# Patient Record
Sex: Male | Born: 1990 | Race: Black or African American | Hispanic: No | Marital: Single | Smoking: Current some day smoker
Health system: Southern US, Community
[De-identification: ages and names within clinical notes are randomized; demographics above are authoritative.]

## PROBLEM LIST (undated history)

## (undated) DIAGNOSIS — L309 Dermatitis, unspecified: Secondary | ICD-10-CM

## (undated) HISTORY — PX: NOSE SURGERY: SHX723

## (undated) HISTORY — DX: Dermatitis, unspecified: L30.9

---
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
## 2010-03-01 ENCOUNTER — Emergency Department (HOSPITAL_COMMUNITY)
Admission: EM | Admit: 2010-03-01 | Discharge: 2010-03-01 | Payer: Self-pay | Source: Home / Self Care | Admitting: Emergency Medicine

## 2014-01-28 ENCOUNTER — Ambulatory Visit (INDEPENDENT_AMBULATORY_CARE_PROVIDER_SITE_OTHER): Payer: 59 | Admitting: Family Medicine

## 2014-01-28 VITALS — BP 112/68 | HR 68 | Temp 99.0°F | Resp 18 | Ht 68.0 in | Wt 152.0 lb

## 2014-01-28 DIAGNOSIS — K921 Melena: Secondary | ICD-10-CM

## 2014-01-28 DIAGNOSIS — L309 Dermatitis, unspecified: Secondary | ICD-10-CM

## 2014-01-28 DIAGNOSIS — R1084 Generalized abdominal pain: Secondary | ICD-10-CM

## 2014-01-28 DIAGNOSIS — R197 Diarrhea, unspecified: Secondary | ICD-10-CM

## 2014-01-28 LAB — POCT CBC
GRANULOCYTE PERCENT: 69 % (ref 37–80)
HCT, POC: 46 % (ref 43.5–53.7)
Hemoglobin: 14.6 g/dL (ref 14.1–18.1)
Lymph, poc: 2 (ref 0.6–3.4)
MCH, POC: 26.5 pg — AB (ref 27–31.2)
MCHC: 31.7 g/dL — AB (ref 31.8–35.4)
MCV: 83.5 fL (ref 80–97)
MID (CBC): 0.2 (ref 0–0.9)
MPV: 9.9 fL (ref 0–99.8)
PLATELET COUNT, POC: 146 10*3/uL (ref 142–424)
POC Granulocyte: 4.9 (ref 2–6.9)
POC LYMPH %: 28.1 % (ref 10–50)
POC MID %: 2.9 % (ref 0–12)
RBC: 5.51 M/uL (ref 4.69–6.13)
RDW, POC: 13.4 %
WBC: 7.1 10*3/uL (ref 4.6–10.2)

## 2014-01-28 LAB — IFOBT (OCCULT BLOOD): IFOBT: POSITIVE

## 2014-01-28 MED ORDER — HYOSCYAMINE SULFATE ER 0.375 MG PO TB12: 0.3750 mg | ORAL_TABLET | Freq: Two times a day (BID) | ORAL | Status: DC

## 2014-01-28 MED ORDER — HYDROCORTISONE 2.5 % EX OINT: TOPICAL_OINTMENT | Freq: Two times a day (BID) | CUTANEOUS | Status: DC

## 2014-01-28 MED ORDER — RANITIDINE HCL 150 MG PO CAPS: 150.0000 mg | ORAL_CAPSULE | Freq: Two times a day (BID) | ORAL | Status: AC

## 2014-01-28 NOTE — Progress Notes (Addendum)
Subjective:    Patient ID: Nicholas Ingram, male    DOB: 10-15-1990, 23 y.o.   MRN: [id number]  HPI Chief Complaint  Patient presents with  . Abdominal Pain    Pt. states he has stomach pain. Stool is black when pt. wipes himself, its red. x1 week   This chart was scribed for Nilda SimmerKristi Jhace Fennell, MD by Andrew Auaven Small, ED Scribe. This patient was seen in room 8 and the patient's care was started at 5:02 PM.  HPI Comments: Nicholas Ingram is a 23 y.o. male who presents to the [facility] complaining of intermittent cramping lower abdominal pain with associated black stool. Pt states last week he ate a stromboli that he left in his car overnight for 2 days, thinking it would be cold enough. Pt reports mild blood on toilet paper only with wiping; blood was the size of a pin prick. Pt denies fever, chills, sweats, nausea, emesis, change in appetite. Pt denies chronic health problems. Pt drinks alcohol on the weekend, smokes cigars occasionally, but denies drugs.  Pt reports 2 surgeries in the past; nasal fracture surgery; revision to circumcision.  He has been taking Pepto Bismol for the past week.  Also took a laxative today hoping to alleviate symptoms.  Has had three stools today that were normal consistency.    L lateral leg with eczematous rash with excoriations; ran out of eczema cream. History reviewed. No pertinent past medical history. No Known Allergies Prior to Admission medications   Not on File   Review of Systems  Constitutional: Negative for fever, chills, diaphoresis, appetite change and fatigue.  Gastrointestinal: Positive for abdominal pain and blood in stool. Negative for nausea, vomiting, diarrhea, constipation, abdominal distention and rectal pain.  Skin: Negative for rash.  Neurological: Negative for dizziness and light-headedness.   Objective:   Physical Exam  Constitutional: He is oriented to person, place, and time. He appears well-developed and  well-nourished. No distress.  HENT:  Head: Normocephalic and atraumatic.  Mouth/Throat: Oropharynx is clear and moist.  Eyes: Conjunctivae and EOM are normal. Pupils are equal, round, and reactive to light.  Neck: Neck supple.  Cardiovascular: Normal rate, regular rhythm and normal heart sounds.  Exam reveals no gallop and no friction rub.   No murmur heard. Pulmonary/Chest: Effort normal and breath sounds normal. No respiratory distress. He has no wheezes. He has no rales.  Abdominal: Soft. Bowel sounds are normal. He exhibits no distension and no mass. There is no splenomegaly or hepatomegaly. There is tenderness in the suprapubic area. There is no rebound and no guarding. No hernia. Hernia confirmed negative in the right inguinal area and confirmed negative in the left inguinal area.  Genitourinary: Rectum normal, testes normal and penis normal. Right testis shows no mass, no swelling and no tenderness. Left testis shows no mass, no swelling and no tenderness. Circumcised.  Brown stool on glove.  Musculoskeletal: Normal range of motion.  Lymphadenopathy:    He has no cervical adenopathy.       Right: No inguinal adenopathy present.       Left: No inguinal adenopathy present.  Neurological: He is alert and oriented to person, place, and time.  Skin: Skin is warm and dry. Rash noted. He is not diaphoretic.  +scaling rash 4cm x 5 cm lateral leg with scattered excoriations.  Psychiatric: He has a normal mood and affect. His behavior is normal.  Nursing note and vitals reviewed.    Results for  orders placed or performed in visit on 01/28/14  IFOBT POC (occult bld, rslt in office)  Result Value Ref Range   IFOBT Positive   POCT CBC  Result Value Ref Range   WBC 7.1 4.6 - 10.2 K/uL   Lymph, poc 2.0 0.6 - 3.4   POC LYMPH PERCENT 28.1 10 - 50 %L   MID (cbc) 0.2 0 - 0.9   POC MID % 2.9 0 - 12 %M   POC Granulocyte 4.9 2 - 6.9   Granulocyte percent 69.0 37 - 80 %G   RBC 5.51 4.69 - 6.13  M/uL   Hemoglobin 14.6 14.1 - 18.1 g/dL   HCT, POC 16.146.0 09.643.5 - 53.7 %   MCV 83.5 80 - 97 fL   MCH, POC 26.5 (A) 27 - 31.2 pg   MCHC 31.7 (A) 31.8 - 35.4 g/dL   RDW, POC 04.513.4 %   Platelet Count, POC 146 142 - 424 K/uL   MPV 9.9 0 - 99.8 fL    Assessment & Plan:  Melena - Plan: IFOBT POC (occult bld, rslt in office), POCT CBC, Comprehensive metabolic panel  Diarrhea - Plan: IFOBT POC (occult bld, rslt in office), POCT CBC, Comprehensive metabolic panel  Generalized abdominal pain - Plan: IFOBT POC (occult bld, rslt in office), POCT CBC, Comprehensive metabolic panel, hyoscyamine (LEVBID) 0.375 MG 12 hr tablet  Eczema   1. Melena: New.  Likely secondary from recent Pepto Bismol use; recommend stopping Pepto Bismol. If melanotic stools persists, recommend follow-up in 48-72 hours.  Hemosure + but recent scan blood on toilet paper as well. If bloody stools persists, RTC 48 hours for repeat CBC. 2.  Abdominal pain and cramping: New.  Recommend BRAT diet, hydration; rx for Zantac 150mg  bid, Hyoscyamine bid.  RTC for acute worsening; obtain labs. 3.  Eczema: uncontrolled; rx for hydrocortisone 2.5% ointment bid.  4. Diarrhea: New. Mild; recommend BRAT diet, hydration.  If no improvement in upcoming week, RTC for stool studies.   Meds ordered this encounter  Medications  . hyoscyamine (LEVBID) 0.375 MG 12 hr tablet    Sig: Take 1 tablet (0.375 mg total) by mouth 2 (two) times daily.    Dispense:  20 tablet    Refill:  0  . ranitidine (ZANTAC) 150 MG capsule    Sig: Take 1 capsule (150 mg total) by mouth 2 (two) times daily.    Dispense:  60 capsule    Refill:  0  . hydrocortisone 2.5 % ointment    Sig: Apply topically 2 (two) times daily.    Dispense:  30 g    Refill:  1    I personally performed the services described in this documentation, which was scribed in my presence. The recorded information has been reviewed and considered.  Nilda SimmerKristi Jeanpaul Biehl, M.D.  [facility]  [place] [address] [place], [place]  [address] [phone] phone [phone] fax

## 2014-01-29 LAB — COMPREHENSIVE METABOLIC PANEL
ALK PHOS: 69 U/L (ref 39–117)
ALT: 21 U/L (ref 0–53)
AST: 28 U/L (ref 0–37)
Albumin: 5.3 g/dL — ABNORMAL HIGH (ref 3.5–5.2)
BUN: 13 mg/dL (ref 6–23)
CALCIUM: 10.3 mg/dL (ref 8.4–10.5)
CHLORIDE: 103 meq/L (ref 96–112)
CO2: 22 mEq/L (ref 19–32)
Creat: 1.05 mg/dL (ref 0.50–1.35)
Glucose, Bld: 102 mg/dL — ABNORMAL HIGH (ref 70–99)
Potassium: 4.1 mEq/L (ref 3.5–5.3)
Sodium: 138 mEq/L (ref 135–145)
Total Bilirubin: 0.8 mg/dL (ref 0.2–1.2)
Total Protein: 7.9 g/dL (ref 6.0–8.3)

## 2014-12-17 ENCOUNTER — Ambulatory Visit (INDEPENDENT_AMBULATORY_CARE_PROVIDER_SITE_OTHER): Payer: Commercial Managed Care - HMO | Admitting: Physician Assistant

## 2014-12-17 VITALS — BP 128/84 | HR 65 | Temp 98.5°F | Resp 14 | Ht 68.0 in | Wt 167.0 lb

## 2014-12-17 DIAGNOSIS — R369 Urethral discharge, unspecified: Secondary | ICD-10-CM

## 2014-12-17 DIAGNOSIS — Z113 Encounter for screening for infections with a predominantly sexual mode of transmission: Secondary | ICD-10-CM | POA: Diagnosis not present

## 2014-12-17 MED ORDER — AZITHROMYCIN 500 MG PO TABS: ORAL_TABLET | ORAL | Status: DC

## 2014-12-17 MED ORDER — CEFTRIAXONE SODIUM 250 MG IJ SOLR
250.0000 mg | Freq: Once | INTRAMUSCULAR | Status: AC
  Administered 2014-12-17: 250 mg via INTRAMUSCULAR

## 2014-12-17 NOTE — Patient Instructions (Signed)
Take 2 antibiotic pills with a meal. Condoms for all sexual intercourse. No sex for 10 days. I will call with your lab results.

## 2014-12-17 NOTE — Progress Notes (Signed)
[facility] [address], [place] [address] [phone]  Date:  12/17/2014   Name:  Nicholas SlaughterJaffa D Kaus   DOB:  [birth year]   MRN:  [id number]  PCP:  No PCP Per Patient    Chief Complaint: Exposure to STD   History of Present Illness:  This is a 24 y.o. male who is presenting with clear penile discharge x 2 days. He will have intermittent tingling/itching at the tip of his penis but no dysuria. He denies genital sores, abdominal pain, back pain, urinary frequency. States he had chlamydia in high school and this is similar to his sx then. Last had STD testing 3-4 months ago and negative. He has only had 1 sexual partner in the past 6 months. They were recently sexually active for the first time but have since broken up. He states she told him he could take the condom off.  Declined flu shot.  Review of Systems:  Review of Systems See HPI  There are no active problems to display for this patient.   Prior to Admission medications   Medication Sig Start Date End Date Taking? Authorizing Provider  ranitidine (ZANTAC) 150 MG capsule Take 1 capsule (150 mg total) by mouth 2 (two) times daily. 01/28/14  Yes Ethelda ChickKristi M Smith, MD    No Known Allergies  Past Surgical History  Procedure Laterality Date  . Nose surgery      Social History  Substance Use Topics  . Smoking status: Current Some Day Smoker  . Smokeless tobacco: None  . Alcohol Use: 0.0 oz/week    0 Standard drinks or equivalent per week    History reviewed. No pertinent family history.  Medication list has been reviewed and updated.  Physical Examination:  Physical Exam  Constitutional: He is oriented to person, place, and time. He appears well-developed and well-nourished. No distress.  HENT:  Head: Normocephalic and atraumatic.  Right Ear: Hearing normal.  Left Ear: Hearing normal.  Nose: Nose normal.  Eyes: Conjunctivae and lids are normal. Right eye exhibits no discharge. Left eye  exhibits no discharge. No scleral icterus.  Pulmonary/Chest: Effort normal. No respiratory distress.  Musculoskeletal: Normal range of motion.  Neurological: He is alert and oriented to person, place, and time.  Skin: Skin is warm, dry and intact. No lesion and no rash noted.  Psychiatric: He has a normal mood and affect. His speech is normal and behavior is normal. Thought content normal.   BP 128/84 mmHg  Pulse 65  Temp(Src) 98.5 F (36.9 C) (Oral)  Resp 14  Ht 5\' 8"  (1.727 m)  Wt 167 lb (75.751 kg)  BMI 25.40 kg/m2  SpO2 98%  Assessment and Plan:  1. Penile discharge 2. Screen for STD STD screening pending. Treated empirically with rocephin and zithromax. Discussed safe sex practices at length. Return prn. - GC/Chlamydia Probe Amp - cefTRIAXone (ROCEPHIN) injection 250 mg; Inject 250 mg into the muscle once. - azithromycin (ZITHROMAX) 500 MG tablet; Take 2 tabs (1 gm) po once.  Dispense: 2 tablet; Refill: 0 - HIV antibody - Hepatitis B surface antigen - Hepatitis C antibody - GC/Chlamydia Probe Amp - RPR   Roswell MinersNicole V. Dyke BrackettBush, PA-C, MHS [facility]  12/17/2014

## 2014-12-18 LAB — HIV ANTIBODY (ROUTINE TESTING W REFLEX): HIV 1&2 Ab, 4th Generation: NONREACTIVE

## 2014-12-18 LAB — HEPATITIS B SURFACE ANTIGEN: Hepatitis B Surface Ag: NEGATIVE

## 2014-12-18 LAB — HEPATITIS C ANTIBODY: HCV Ab: NEGATIVE

## 2014-12-18 LAB — RPR

## 2014-12-19 LAB — GC/CHLAMYDIA PROBE AMP
CT PROBE, AMP APTIMA: NEGATIVE
GC Probe RNA: NEGATIVE

## 2015-06-18 ENCOUNTER — Ambulatory Visit (INDEPENDENT_AMBULATORY_CARE_PROVIDER_SITE_OTHER): Payer: Commercial Managed Care - HMO

## 2015-06-18 ENCOUNTER — Ambulatory Visit (INDEPENDENT_AMBULATORY_CARE_PROVIDER_SITE_OTHER): Payer: Commercial Managed Care - HMO | Admitting: Family Medicine

## 2015-06-18 VITALS — BP 122/72 | HR 90 | Temp 98.1°F | Resp 17 | Ht 69.5 in | Wt 165.0 lb

## 2015-06-18 DIAGNOSIS — M25442 Effusion, left hand: Secondary | ICD-10-CM
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
## 2015-06-18 NOTE — Patient Instructions (Addendum)
Boxer's Fracture A boxer's fracture is a break (fracture) of the bone in your hand that connects your little finger to your wrist (fifth metacarpal). This type of fracture usually happens at the end of the bone, closest to the little finger. The knuckle is often pushed down by the impact. In some cases, only a splint or brace is needed, or you may need a cast. Casting or splinting may include taping your injured finger to the next finger (buddy taping). You may need surgery to repair the fracture. This may involve the use of wires, screws, or plates to hold the bone pieces in place.  CAUSES This injury may be caused by:   Hitting an object with a clenched fist.  A hard, direct hit to the hand.  An injury that crushes the hand. RISK FACTORS This injury is more likely to occur if:  You are in a fistfight.  You have certain bone diseases. SYMPTOMS Symptoms of this type of fracture develop soon after the injury. Symptoms may include:  Swelling of the hand.  Pain.  Pain when moving the fifth finger or touching the hand.  Abnormal position of the finger.  Not being able to move the finger.  A shortened finger.  A finger knuckle that looks sunken in. DIAGNOSIS This injury may be diagnosed based on your symptoms, especially if you had a recent hand injury. Your health care provider will perform a physical exam, and you may also have X-rays to confirm the diagnosis. TREATMENT  Treatment for this injury depends on how severe it is. Possible treatments include:  Closed reduction. If your bone is stable and can be moved back into place, you may only need to wear a cast or splint or have buddy taping.  Open reduction with internal fixation (ORIF). This may be needed if your fracture is far out of place or goes through the joint surface of the bone. This treatment involves open surgery to move your bones back into the right position. Screws, wires, or plates may be used to stabilize the  fracture. You may need to wear a cast or a splint for several weeks. You will also need to have follow-up X-rays to make sure that the bone is healing well and staying in position. After you no longer need the cast or splint, you may need physical therapy. This will help you to regain full movement and strength in your hand.  HOME CARE INSTRUCTIONS If You Have a Cast:  Do not stick anything inside the cast to scratch your skin. Doing that increases your risk of infection.  Check the skin around the cast every day. Report any concerns to your health care provider. You may put lotion on dry skin around the edges of the cast. Do not apply lotion to the skin underneath the cast. If You Have a Splint:  Wear it as directed by your health care provider. Remove it only as directed by your health care provider.  Loosen the splint if your fingers become numb and tingle, or if they turn cold and blue. Bathing  Cover the cast or splint with a watertight plastic bag to protect it from water while you take a bath or a shower. Do not let the cast or splint get wet. Managing Pain, Stiffness, and Swelling  If directed, apply ice to the injured area (if you have a splint, not a cast):  Put ice in a plastic bag.  Place a towel between your skin and the bag.  Leave the ice on for 20 minutes, 2-3 times a day.  Move your fingers often to avoid stiffness and to lessen swelling.  Raise the injured area above the level of your heart while you are sitting or lying down. Driving  Do not drive or operate heavy machinery while taking pain medicine.  Do not drive while wearing a cast or splint on a hand or foot that you use for driving. Activity  Return to your normal activities as directed by your health care provider. Ask your health care provider what activities are safe for you. General Instructions  Do not put pressure on any part of the cast or splint until it is fully hardened. This may take several  hours.  Keep the cast or splint clean and dry.  Do not use any tobacco products, including cigarettes, chewing tobacco, or electronic cigarettes. Tobacco can delay bone healing. If you need help quitting, ask your health care provider.  Take medicines only as directed by your health care provider.  Keep all follow-up visits as directed by your health care provider. This is important. SEEK MEDICAL CARE IF:  Your pain is getting worse.  You have redness, swelling, or pain in the injured area.  You have fluid, blood, or pus coming from under your cast or splint.  You notice a bad smell coming from under your cast or splint.  You have a fever.  Your cast or splint feels too tight or too loose.  You cast is coming apart. SEEK IMMEDIATE MEDICAL CARE IF:  You develop a rash.  You have trouble breathing.  Your skin or nails on your injured hand turn blue or gray even after you loosen your splint.  Your injured hand feels cold or becomes numb even after you loosen your splint.  You develop severe pain under the cast or in your hand.   This information is not intended to replace advice given to you by your health care provider. Make sure you discuss any questions you have with your health care provider.   Document Released: 02/16/2005 Document Revised: 11/07/2014 Document Reviewed: 12/06/2013 Elsevier Interactive Patient Education 2016 [employer].    IF you received an x-ray today, you will receive an invoice from [facility]. Please contact [facility] at [phone] with questions or concerns regarding your invoice.   IF you received labwork today, you will receive an invoice from [employer]. Please contact [facility] at [phone] with questions or concerns regarding your invoice.   Our billing staff will not be able to assist you with questions regarding bills from these companies.  You will be contacted with the lab results  as soon as they are available. The fastest way to get your results is to activate your My Chart account. Instructions are located on the last page of this paperwork. If you have not heard from [place] regarding the results in 2 weeks, please contact this office.

## 2015-06-23 NOTE — Progress Notes (Signed)
   Subjective:    Patient ID: Nicholas Ingram, male    DOB: 1990-11-29, 25 y.o.   MRN: [id number]  HPI  Pt returned today for wound care for boxers fracture sustained one week ago. Was seen in pour clinic 4/18, diagnosed with boxers fracture and had sugar tong splint applied. Today he returns as he feels the splint is too tight and uncomfortable.   Review of Systems     Objective:   Physical Exam        Assessment & Plan:   Sugar tong splint removed. Left UE assessed. Normal cap refill all 5 digits. 2+ radial pulse. Normal sensation across hand and digits. Decreased rom secondary to 5th digit pain. Mild-moderate diffuse swelling. No abrasions noted from prior sugar tong splint.   Ulnar gutter splint applied today. Area still ttp. Pt instructed to rtc one week for repeat assessment. He is agreeable.

## 2017-05-04 ENCOUNTER — Other Ambulatory Visit: Payer: Self-pay

## 2017-05-04 ENCOUNTER — Ambulatory Visit (HOSPITAL_COMMUNITY)
Admission: EM | Admit: 2017-05-04 | Discharge: 2017-05-04 | Disposition: A | Discharge status: Home / Self Care | Payer: Self-pay | Attending: Family Medicine | Admitting: Family Medicine

## 2017-05-04 ENCOUNTER — Encounter (HOSPITAL_COMMUNITY): Payer: Self-pay

## 2017-05-04 DIAGNOSIS — L600 Ingrowing nail: Secondary | ICD-10-CM

## 2017-05-04 MED ORDER — DOXYCYCLINE HYCLATE 100 MG PO TABS: 100.0000 mg | ORAL_TABLET | Freq: Two times a day (BID) | ORAL | 0 refills | Status: AC

## 2017-05-04 MED ORDER — LIDOCAINE HCL 2 % IJ SOLN
INTRAMUSCULAR | Status: AC
  Filled 2017-05-04: qty 20

## 2017-05-04 NOTE — ED Triage Notes (Signed)
Pt presents with complaints of ingrown toenail x 1 week.

## 2017-05-04 NOTE — Discharge Instructions (Signed)
Gently soak the toe in soapy water twice a day for two days.

## 2017-05-04 NOTE — ED Provider Notes (Signed)
[facility]   [id number] 05/04/17 Arrival Time: 1623   SUBJECTIVE:  Nicholas SlaughterJaffa D Aldous is a 27 y.o. male who presents to the urgent care with complaint of right ingrown toenail x 1 week.  Past Medical History:  Diagnosis Date  . Eczema    No family history on file. Social History   Socioeconomic History  . Marital status: Single    Spouse name: Not on file  . Number of children: Not on file  . Years of education: Not on file  . Highest education level: Not on file  Social Needs  . Financial resource strain: Not on file  . Food insecurity - worry: Not on file  . Food insecurity - inability: Not on file  . Transportation needs - medical: Not on file  . Transportation needs - non-medical: Not on file  Occupational History  . Not on file  Tobacco Use  . Smoking status: Current Some Day Smoker    Packs/day: 0.50    Years: 3.00    Pack years: 1.50    Types: Cigars, Cigarettes  Substance and Sexual Activity  . Alcohol use: Yes    Alcohol/week: 0.0 oz  . Drug use: No  . Sexual activity: Not on file  Other Topics Concern  . Not on file  Social History Narrative  . Not on file   Current Meds  Medication Sig  . ranitidine (ZANTAC) 150 MG capsule Take 1 capsule (150 mg total) by mouth 2 (two) times daily.   No Known Allergies    ROS: As per HPI, remainder of ROS negative.   OBJECTIVE:   Vitals:   05/04/17 1637  BP: (!) 142/82  Pulse: 74  Resp: 18  Temp: 97.8 F (36.6 C)  SpO2: 98%  Weight: 190 lb (86.2 kg)     General appearance: alert; no distress Eyes: PERRL; EOMI; conjunctiva normal HENT: normocephalic; atraumatic; Neck: supple Back: no CVA tenderness Extremities: no cyanosis or edema; symmetrical with no gross deformities Skin: warm and dry Neurologic: normal gait; grossly normal Psychological: alert and cooperative; normal mood and affect      Labs:  Results for orders placed or performed in visit on 12/17/14  GC/Chlamydia Probe  Amp  Result Value Ref Range   CT Probe RNA NEGATIVE    GC Probe RNA NEGATIVE   HIV antibody  Result Value Ref Range   HIV 1&2 Ab, 4th Generation NONREACTIVE NONREACTIVE  Hepatitis B surface antigen  Result Value Ref Range   Hepatitis B Surface Ag NEGATIVE NEGATIVE  Hepatitis C antibody  Result Value Ref Range   HCV Ab NEGATIVE NEGATIVE  RPR  Result Value Ref Range   RPR Ser Ql NON REAC NON REAC    Labs Reviewed - No data to display  No results found.     ASSESSMENT & PLAN:  1. Ingrown toenail of right foot with infection     Meds ordered this encounter  Medications  . doxycycline (VIBRA-TABS) 100 MG tablet    Sig: Take 1 tablet (100 mg total) by mouth 2 (two) times daily.    Dispense:  20 tablet    Refill:  0    Reviewed expectations re: course of current medical issues. Questions answered. Outlined signs and symptoms indicating need for more acute intervention. Patient verbalized understanding. After Visit Summary given.    Procedures:  Edge of right toenail removed after 2% Xylocaine local anesthetic and sterile prep.      Elvina SidleLauenstein, Willem Klingensmith, MD 05/04/17 1710

## 2017-09-21 IMAGING — CR DG HAND COMPLETE 3+V*L*
3 series · 3 of 3 positions shown · non-contrast
Comparison: None.

CLINICAL DATA: Pain and swelling following trauma

EXAM:
LEFT HAND - COMPLETE 3+ VIEW

[PA]
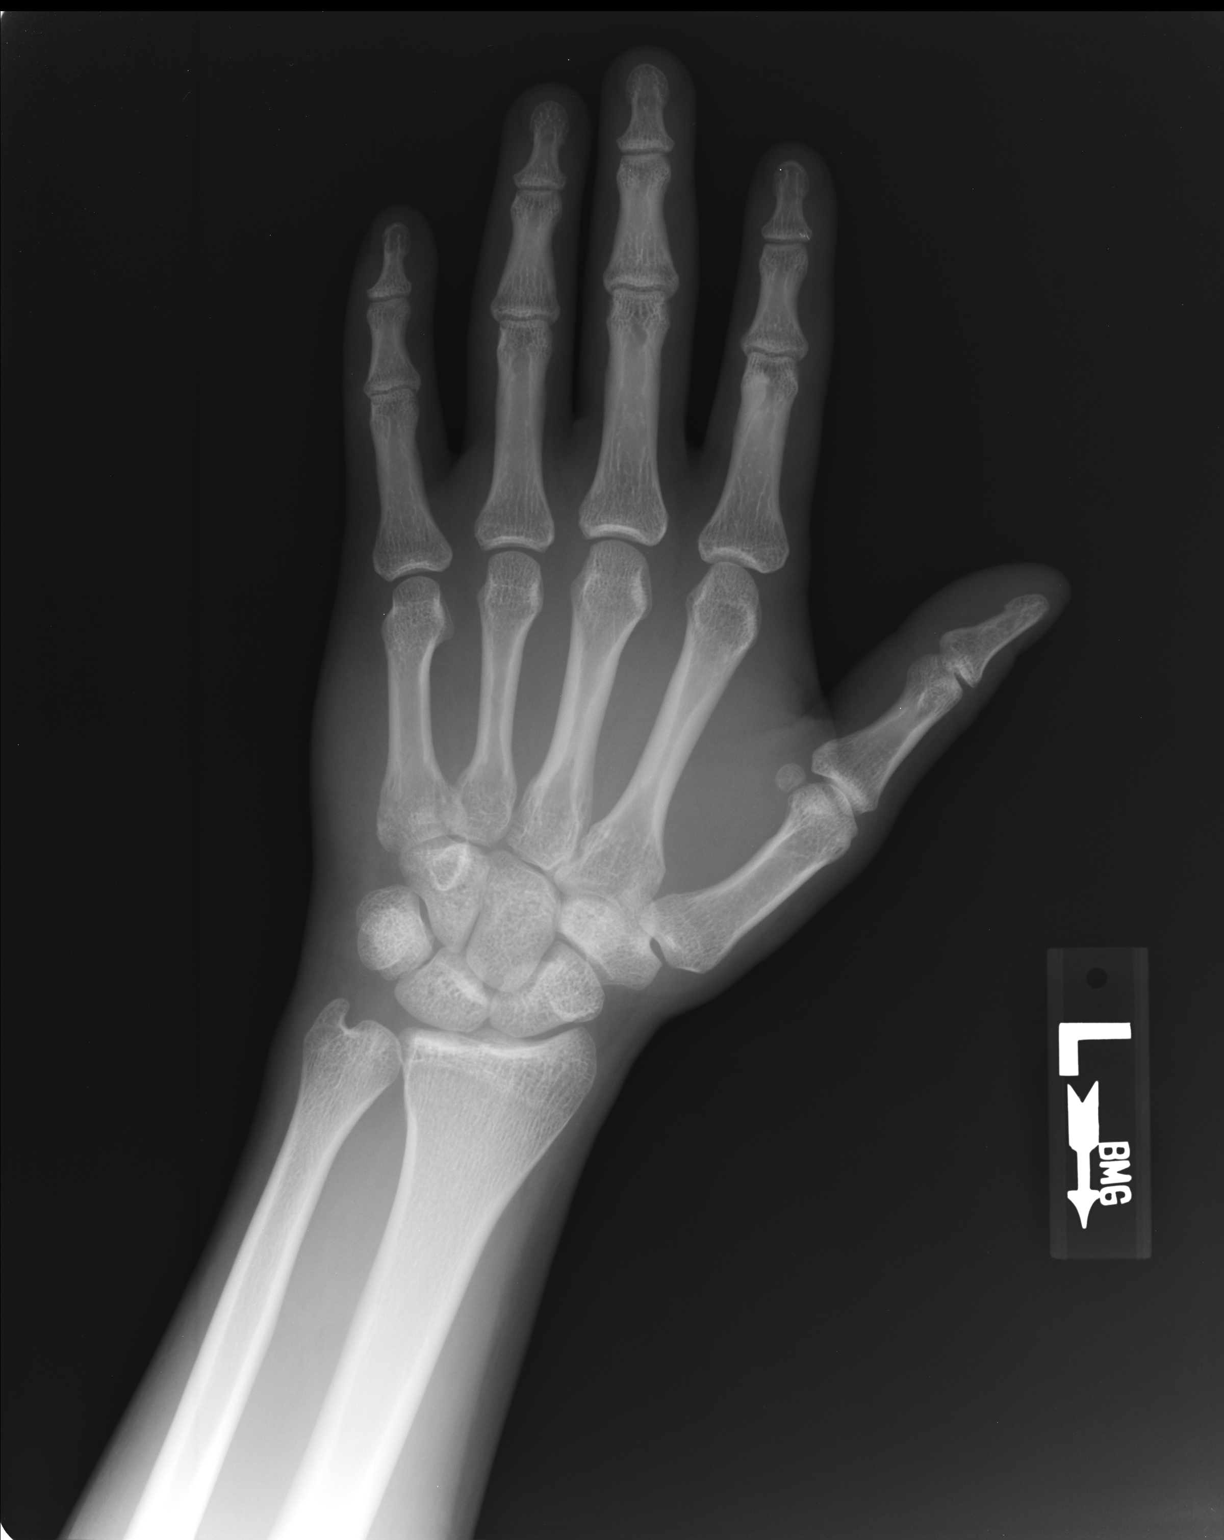

[pa obl]
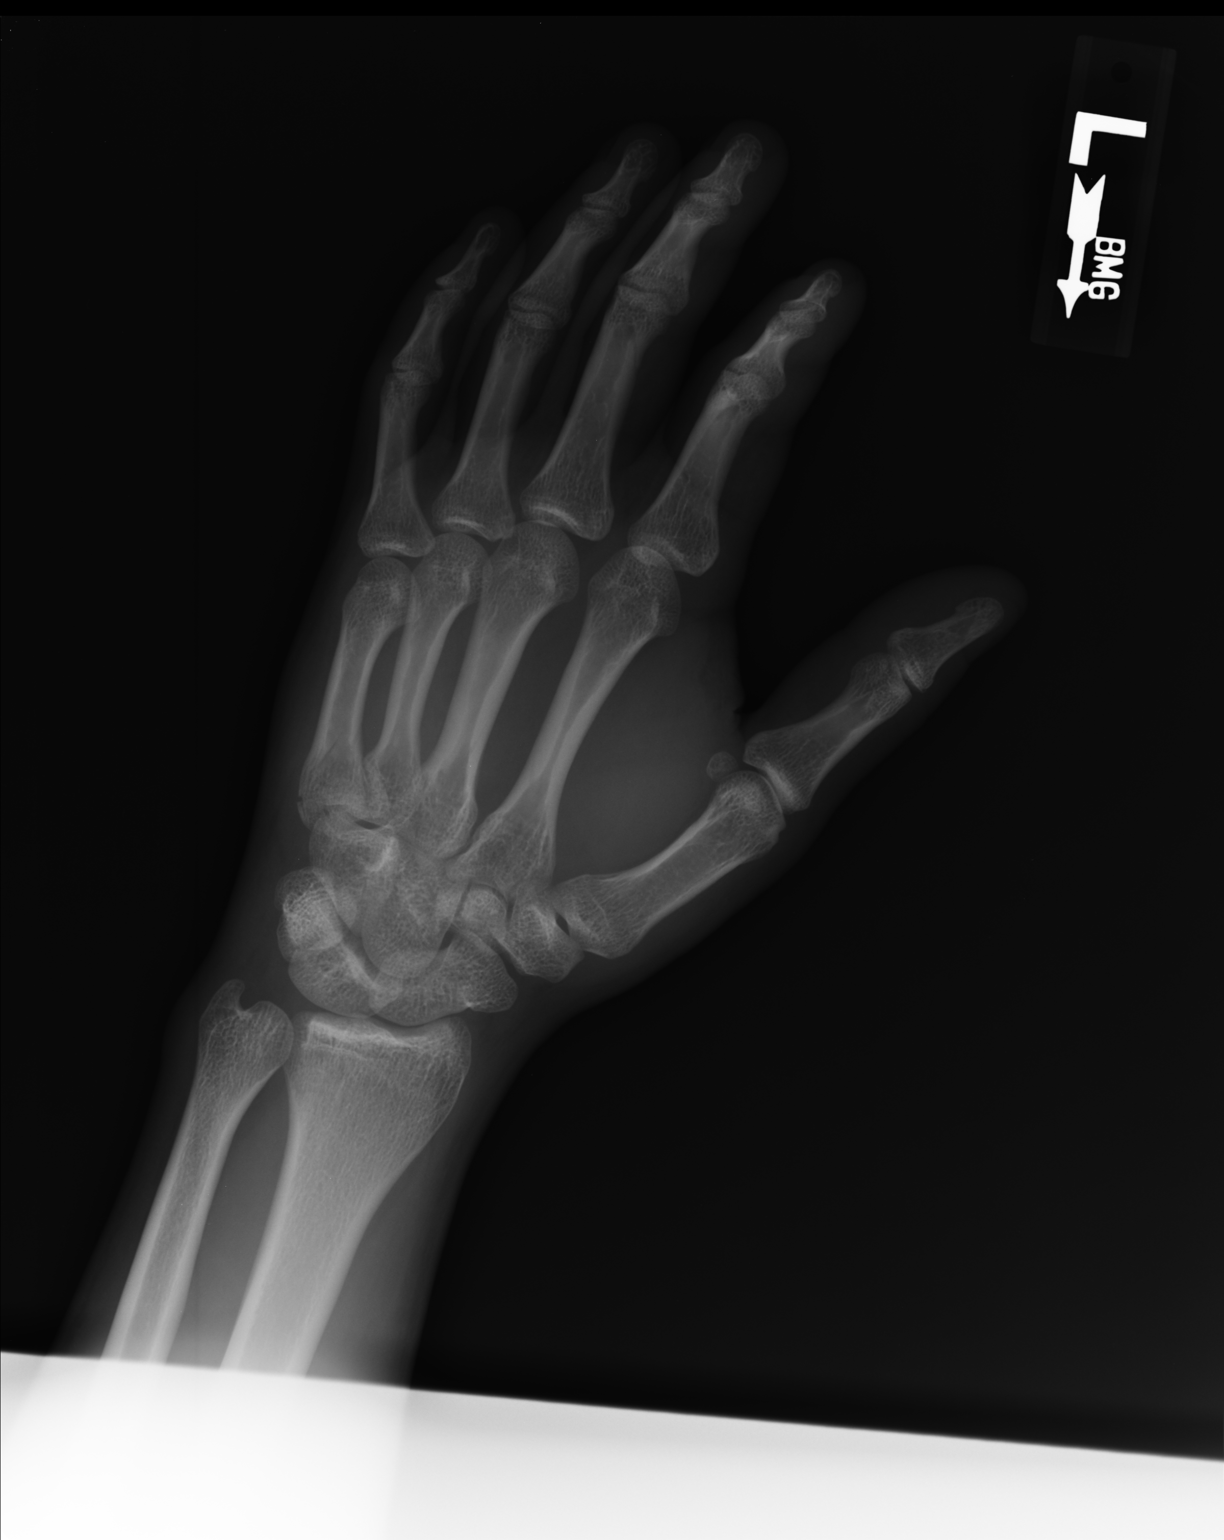

[lateral]
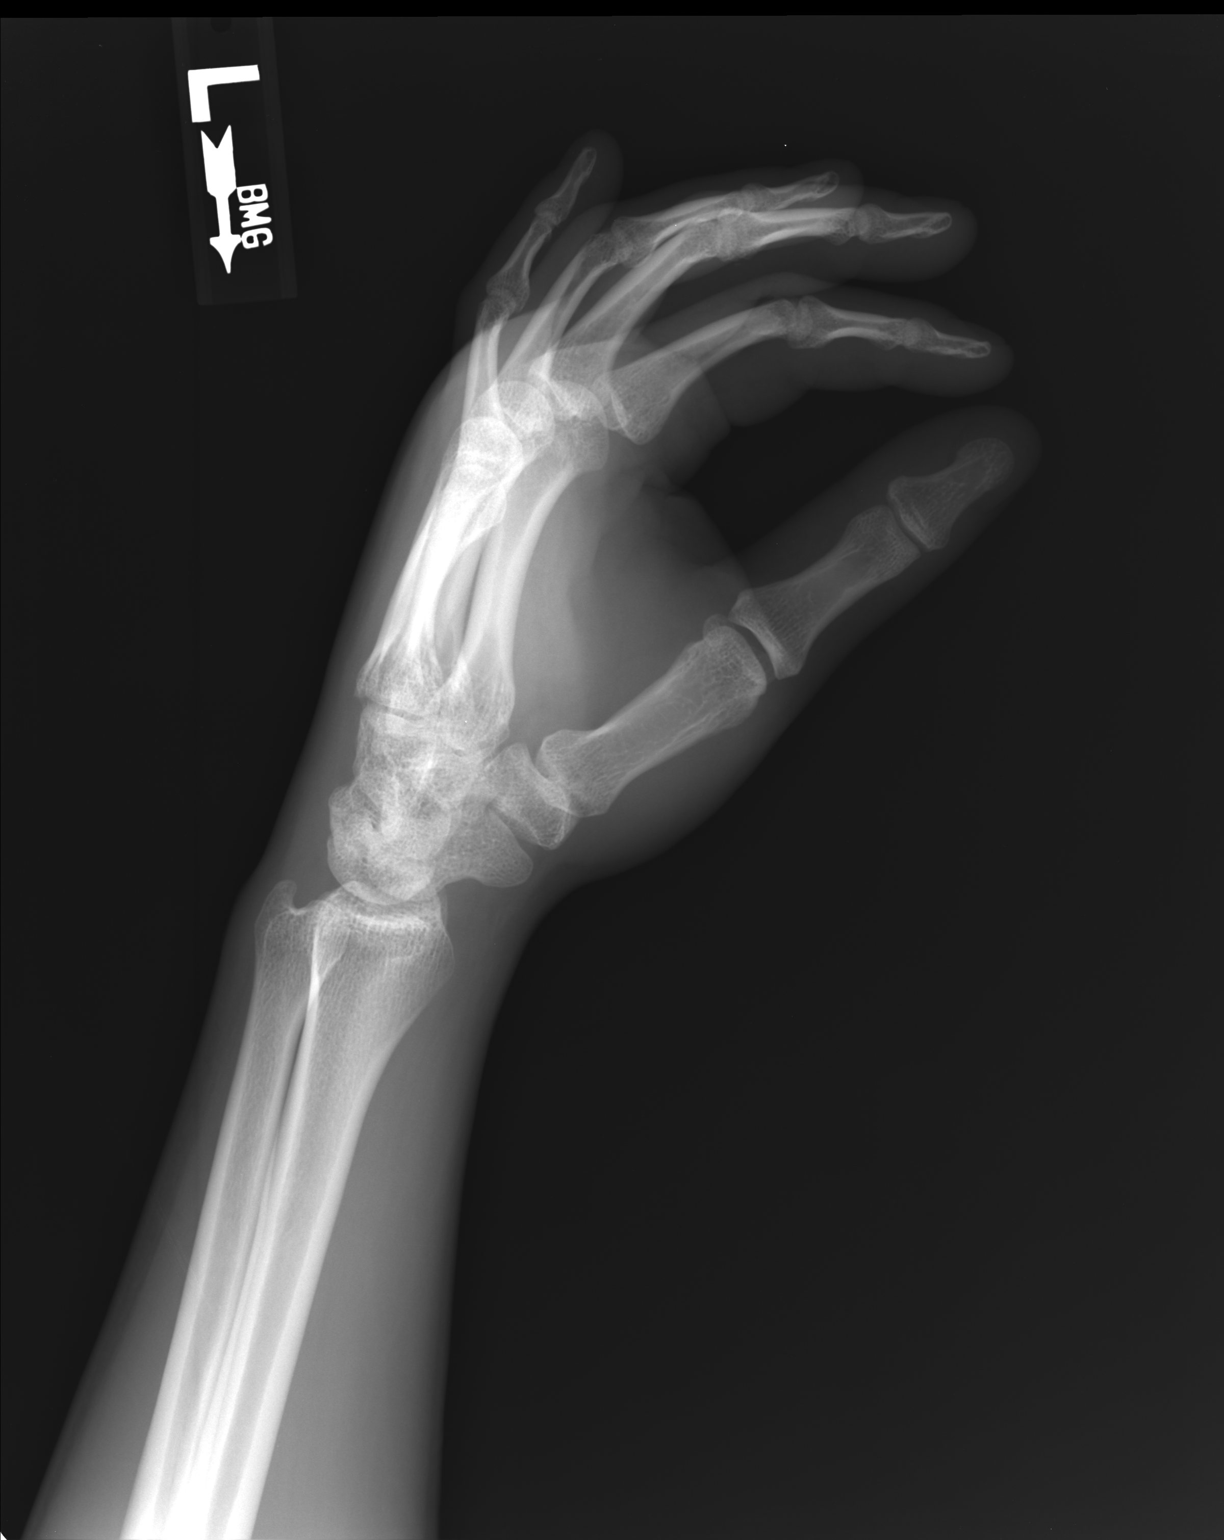

[3 of 3 positions shown; findings below may reference images not displayed]

FINDINGS: Frontal, oblique, and lateral views were obtained. There is an
avulsion type fracture along the medial proximal most aspect of the
fifth metacarpal with slight displacement of the avulsed fragment
proximally. There is a nondisplaced obliquely oriented fracture just
distal to this avulsion in the proximal fifth metacarpal. No other
fractures. No dislocations. Joint spaces appear intact. No erosive
change.
IMPRESSION: Fractures of the proximal fifth metacarpal along the medial aspect.
An a avulsed fragment is slightly displaced proximally. No
dislocation. No other fractures. No arthropathy.

These results will be called to the ordering clinician or
representative by the Radiologist Assistant, and communication
documented in the PACS or zVision Dashboard.

## 2019-01-11 DIAGNOSIS — J705 Respiratory conditions due to smoke inhalation: Secondary | ICD-10-CM | POA: Diagnosis not present

## 2019-01-11 DIAGNOSIS — X001XXA Exposure to smoke in uncontrolled fire in building or structure, initial encounter: Secondary | ICD-10-CM | POA: Diagnosis not present

## 2019-09-26 ENCOUNTER — Ambulatory Visit: Payer: Self-pay | Admitting: Physician Assistant

## 2019-09-26 ENCOUNTER — Other Ambulatory Visit: Payer: Self-pay

## 2019-09-26 DIAGNOSIS — Z113 Encounter for screening for infections with a predominantly sexual mode of transmission: Secondary | ICD-10-CM

## 2019-09-26 DIAGNOSIS — Z202 Contact with and (suspected) exposure to infections with a predominantly sexual mode of transmission: Secondary | ICD-10-CM

## 2019-09-26 LAB — GRAM STAIN

## 2019-09-26 MED ORDER — METRONIDAZOLE 500 MG PO TABS: 2000.0000 mg | ORAL_TABLET | Freq: Once | ORAL | 0 refills | Status: AC

## 2019-09-26 NOTE — Progress Notes (Signed)
Gram stain reviewed and is negative today. Pt treated as a contact to Trich per provider and standing orders. Provider orders completed.

## 2019-09-29 ENCOUNTER — Encounter: Payer: Self-pay | Admitting: Physician Assistant

## 2019-09-29 NOTE — Progress Notes (Signed)
[facility] Department STI clinic/screening visit  Subjective:  Nicholas Ingram is a 29 y.o. male being seen today for an STI screening visit. The patient reports they do not have symptoms.    Patient has the following medical conditions:  There are no problems to display for this patient.    Chief Complaint  Patient presents with  . SEXUALLY TRANSMITTED DISEASE    screening    HPI  Patient reports that he is not having any symptoms but would like a screening today.  Also, states that he is a contact to [place].  Denies chronic conditions and regular medicines.  States last HIV testing was 3-4 years ago and last void prior to sample collection for Gram stain was over 2 hr ago.   See flowsheet for further details and programmatic requirements.    The following portions of the patient's history were reviewed and updated as appropriate: allergies, current medications, past medical history, past social history, past surgical history and problem list.  Objective:  There were no vitals filed for this visit.  Physical Exam Constitutional:      General: He is not in acute distress.    Appearance: Normal appearance.  HENT:     Head: Normocephalic and atraumatic.     Comments: No nits, lice, or hair loss. No cervical, supraclavicular or axillary adenopathy.    Mouth/Throat:     Mouth: Mucous membranes are moist.     Pharynx: Oropharynx is clear. No oropharyngeal exudate or posterior oropharyngeal erythema.  Eyes:     Conjunctiva/sclera: Conjunctivae normal.  Pulmonary:     Effort: Pulmonary effort is normal.  Abdominal:     Palpations: Abdomen is soft. There is no mass.     Tenderness: There is no abdominal tenderness. There is no guarding or rebound.  Genitourinary:    Penis: Normal.      Testes: Normal.     Comments: Pubic area without nits, lice, edema, erythema, lesions, and inguinal adenopathy. Penis circumcised, without rash, lesions and discharge at  meatus. Musculoskeletal:     Cervical back: Neck supple. No tenderness.  Skin:    General: Skin is warm and dry.     Findings: No bruising, erythema, lesion or rash.  Neurological:     Mental Status: He is alert and oriented to person, place, and time.  Psychiatric:        Mood and Affect: Mood normal.        Behavior: Behavior normal.        Thought Content: Thought content normal.        Judgment: Judgment normal.       Assessment and Plan:  Nicholas Ingram is a 29 y.o. male presenting to the [facility] Department for STI screening  1. Screening for STD (sexually transmitted disease) Patient into clinic without symptoms. Rec condoms with all sex. Await test results.  Counseled that RN will call if needs to RTC for treatment once results are back. - Gram stain - Gonococcus culture - HIV [place] - Syphilis Serology, [facility]  2. Venereal disease contact Treat as a contact to [place] with Metronidazole 2 g po with food, no EtOH for 24 hr before and until 72 hr after taking medicine. No sex for 7 days. RTC for re-treatment if vomits < 2 hr after taking medicine. - metroNIDAZOLE (FLAGYL) 500 MG tablet; Take 4 tablets (2,000 mg total) by mouth once for 1 dose.  Dispense: 4 tablet;  Refill: 0     Return for 2-3 weeks for TR's, and PRN.  No future appointments.  Matt Holmes, PA

## 2019-10-01 LAB — GONOCOCCUS CULTURE

## 2019-12-12 ENCOUNTER — Other Ambulatory Visit: Payer: Self-pay

## 2019-12-12 DIAGNOSIS — Z20822 Contact with and (suspected) exposure to covid-19: Secondary | ICD-10-CM

## 2019-12-13 LAB — SARS-COV-2, NAA 2 DAY TAT

## 2019-12-13 LAB — NOVEL CORONAVIRUS, NAA: SARS-CoV-2, NAA: DETECTED — AB

## 2019-12-14 ENCOUNTER — Telehealth: Payer: Self-pay | Admitting: Oncology

## 2019-12-14 NOTE — Telephone Encounter (Signed)
Called to Discuss with patient about Covid symptoms and the use of the monoclonal antibody infusion for those with mild to moderate Covid symptoms and at a high risk of hospitalization.     Pt is qualified for this infusion due to co-morbid conditions and/or a member of an at-risk group.     There are no problems to display for this patient.   Patient declines infusion at this time. Symptoms tier reviewed as well as criteria for ending isolation. Preventative practices reviewed. Patient verbalized understanding.    Patient advised to call back if he/she decides that he/she does want to get infusion. Callback number to the infusion center given. Patient advised to go to Urgent care or ED with severe symptoms.   Durenda Hurt, NP 12/14/2019 10:26 AM
